# Patient Record
Sex: Female | Born: 1935 | Race: White | Hispanic: No | Smoking: Former smoker
Health system: Southern US, Community
[De-identification: ages and names within clinical notes are randomized; demographics above are authoritative.]

## PROBLEM LIST (undated history)

## (undated) DIAGNOSIS — C50919 Malignant neoplasm of unspecified site of unspecified female breast: Secondary | ICD-10-CM

## (undated) DIAGNOSIS — C189 Malignant neoplasm of colon, unspecified: Secondary | ICD-10-CM

## (undated) DIAGNOSIS — M779 Enthesopathy, unspecified: Secondary | ICD-10-CM

## (undated) DIAGNOSIS — M778 Other enthesopathies, not elsewhere classified: Secondary | ICD-10-CM

## (undated) DIAGNOSIS — I639 Cerebral infarction, unspecified: Secondary | ICD-10-CM

## (undated) HISTORY — DX: Other enthesopathies, not elsewhere classified: M77.8

## (undated) HISTORY — DX: Enthesopathy, unspecified: M77.9

## (undated) HISTORY — PX: MELANOMA EXCISION: SHX5266

## (undated) HISTORY — DX: Cerebral infarction, unspecified: I63.9

## (undated) HISTORY — DX: Malignant neoplasm of colon, unspecified: C18.9

## (undated) HISTORY — PX: BREAST LUMPECTOMY: SHX2

## (undated) HISTORY — PX: OTHER SURGICAL HISTORY: SHX169

## (undated) HISTORY — DX: Malignant neoplasm of unspecified site of unspecified female breast: C50.919

---

## 2005-05-25 ENCOUNTER — Ambulatory Visit: Payer: Self-pay | Admitting: Oncology

## 2005-06-09 ENCOUNTER — Ambulatory Visit: Payer: Self-pay | Admitting: Internal Medicine

## 2005-06-10 ENCOUNTER — Ambulatory Visit: Payer: Self-pay | Admitting: Oncology

## 2005-07-03 ENCOUNTER — Emergency Department: Payer: Self-pay | Admitting: Emergency Medicine

## 2005-07-21 ENCOUNTER — Emergency Department: Payer: Self-pay | Admitting: Emergency Medicine

## 2005-09-22 ENCOUNTER — Ambulatory Visit: Payer: Self-pay | Admitting: Oncology

## 2005-10-08 ENCOUNTER — Ambulatory Visit: Payer: Self-pay | Admitting: Oncology

## 2005-12-20 ENCOUNTER — Ambulatory Visit: Payer: Self-pay | Admitting: Unknown Physician Specialty

## 2006-01-30 ENCOUNTER — Emergency Department: Payer: Self-pay | Admitting: Emergency Medicine

## 2006-02-19 ENCOUNTER — Ambulatory Visit: Payer: Self-pay | Admitting: Internal Medicine

## 2006-03-21 ENCOUNTER — Ambulatory Visit: Payer: Self-pay | Admitting: Oncology

## 2006-04-04 ENCOUNTER — Encounter: Payer: Self-pay | Admitting: Internal Medicine

## 2006-04-09 ENCOUNTER — Ambulatory Visit: Payer: Self-pay | Admitting: Oncology

## 2006-06-14 ENCOUNTER — Ambulatory Visit: Payer: Self-pay | Admitting: Oncology

## 2006-06-20 ENCOUNTER — Ambulatory Visit: Payer: Self-pay | Admitting: Unknown Physician Specialty

## 2006-09-19 ENCOUNTER — Ambulatory Visit: Payer: Self-pay | Admitting: Oncology

## 2006-10-09 ENCOUNTER — Ambulatory Visit: Payer: Self-pay | Admitting: Oncology

## 2007-03-11 ENCOUNTER — Ambulatory Visit: Payer: Self-pay | Admitting: Oncology

## 2007-03-22 ENCOUNTER — Ambulatory Visit: Payer: Self-pay | Admitting: Oncology

## 2007-04-10 ENCOUNTER — Ambulatory Visit: Payer: Self-pay | Admitting: Oncology

## 2007-06-11 ENCOUNTER — Ambulatory Visit: Payer: Self-pay | Admitting: Oncology

## 2007-06-19 ENCOUNTER — Ambulatory Visit: Payer: Self-pay | Admitting: Oncology

## 2007-09-08 ENCOUNTER — Ambulatory Visit: Payer: Self-pay | Admitting: Oncology

## 2007-09-13 ENCOUNTER — Ambulatory Visit: Payer: Self-pay | Admitting: Oncology

## 2007-10-09 ENCOUNTER — Ambulatory Visit: Payer: Self-pay | Admitting: Oncology

## 2008-02-08 ENCOUNTER — Ambulatory Visit: Payer: Self-pay | Admitting: Oncology

## 2008-02-29 IMAGING — CT CT HEAD WITHOUT CONTRAST
2 series · 15 of 30 positions shown, 19 images · non-contrast
Comparison: none

REASON FOR EXAM: dizziness
COMMENTS:

[Series 2: without · axial · non-contrast · 0.41mm/px · z∈[-150,-25]mm · 13 of 31 slices shown, 17 images]
[im 3/31  brain]
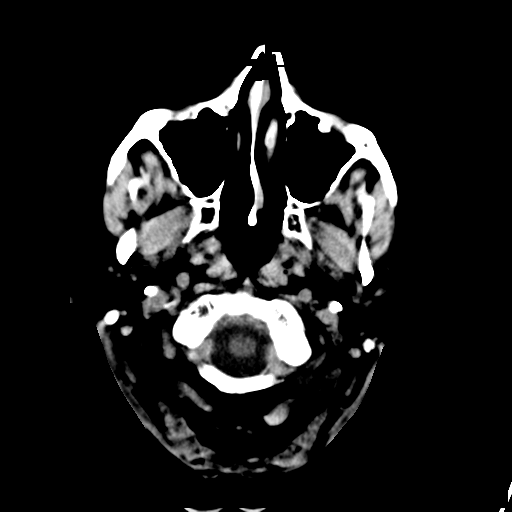
[im 3/31  bone]
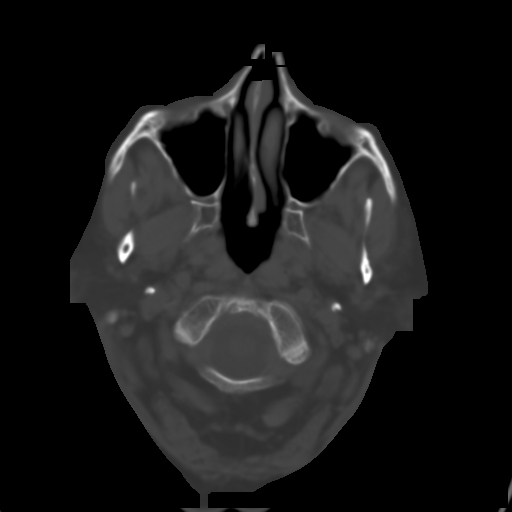
[im 5/31  brain]
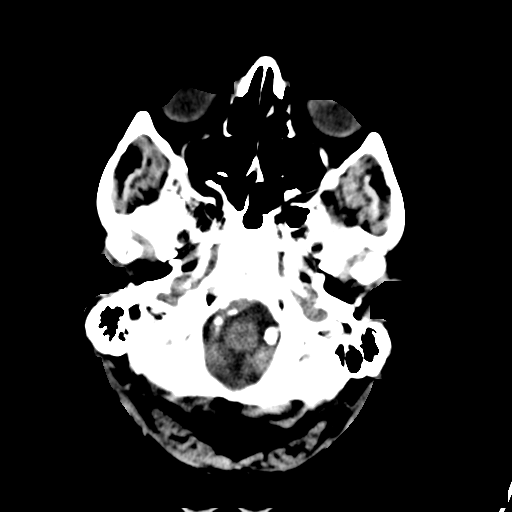
[im 7/31  brain]
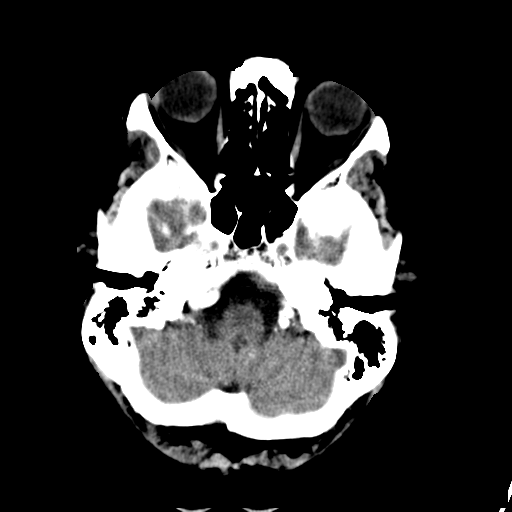
[im 9/31  brain]
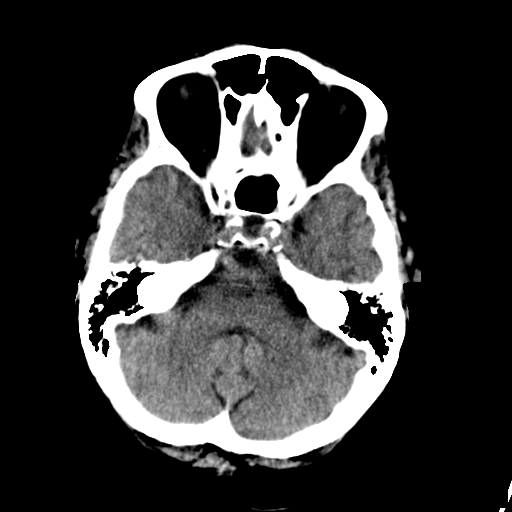
[im 11/31  brain]
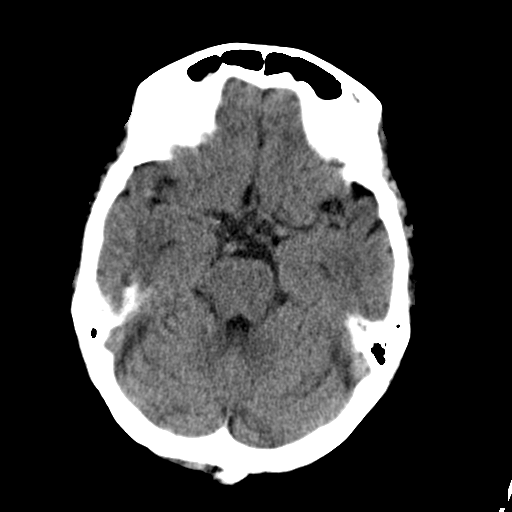
[im 11/31  bone]
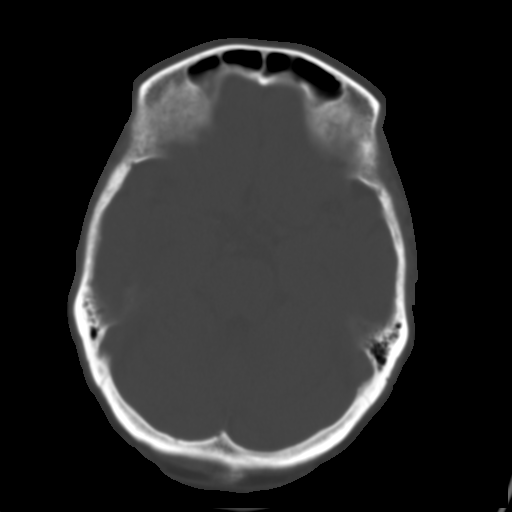
[im 13/31  brain]
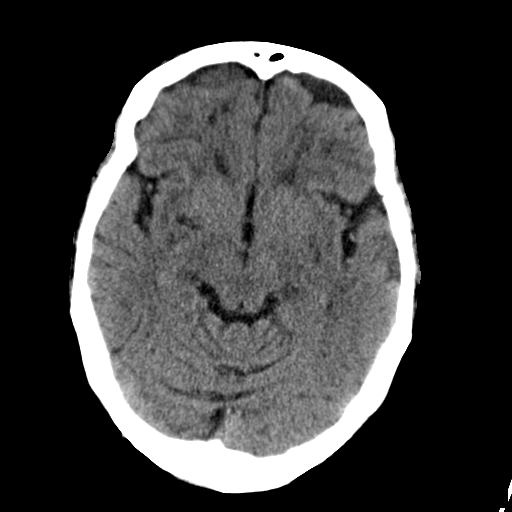
[im 16/31  brain]
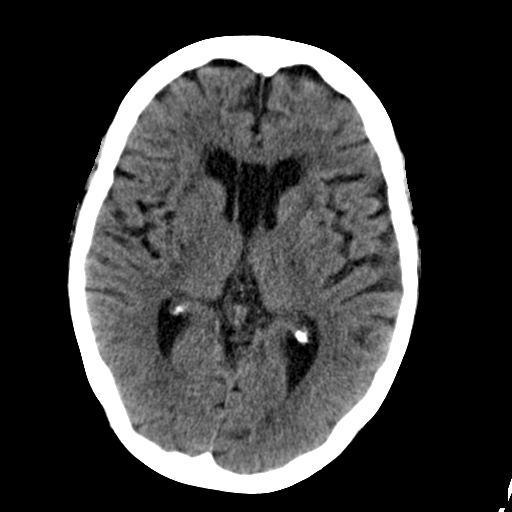
[im 18/31  brain]
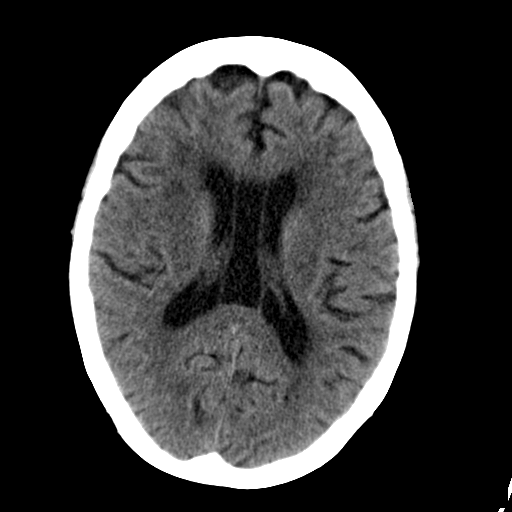
[im 20/31  brain]
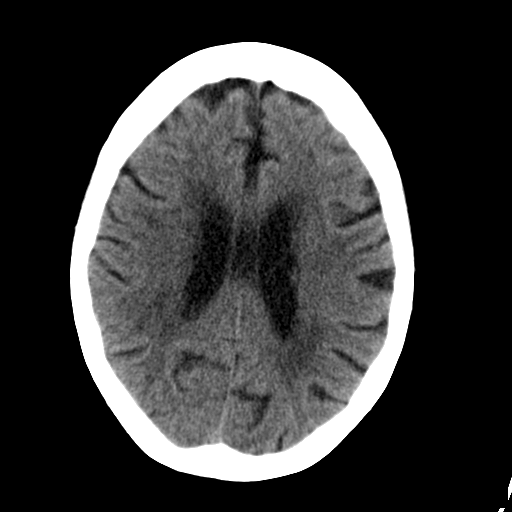
[im 20/31  bone]
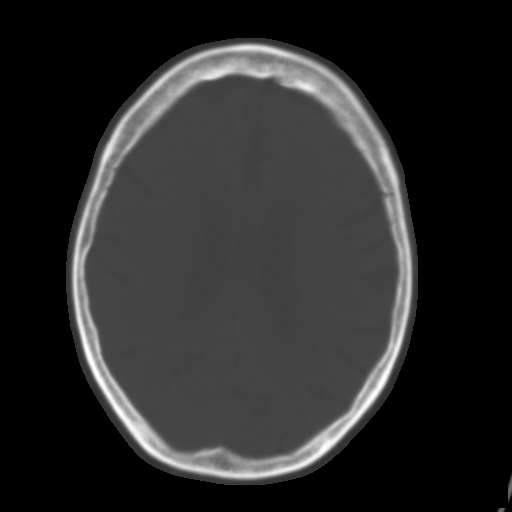
[im 22/31  brain]
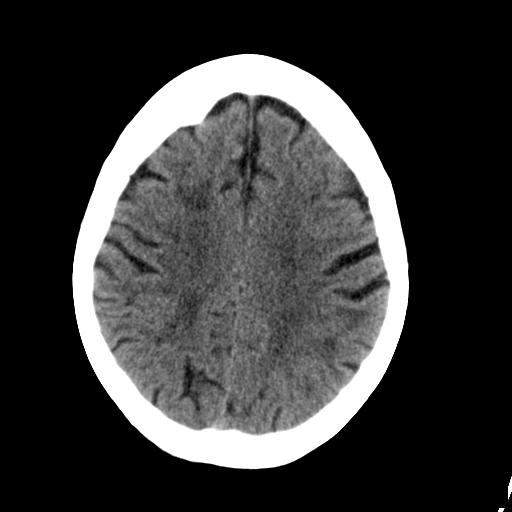
[im 24/31  brain]
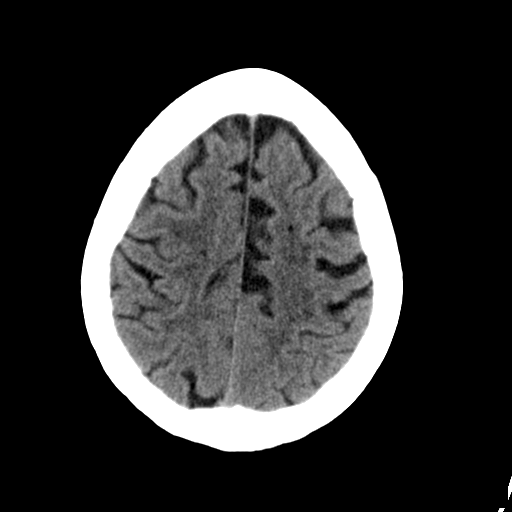
[im 26/31  brain]
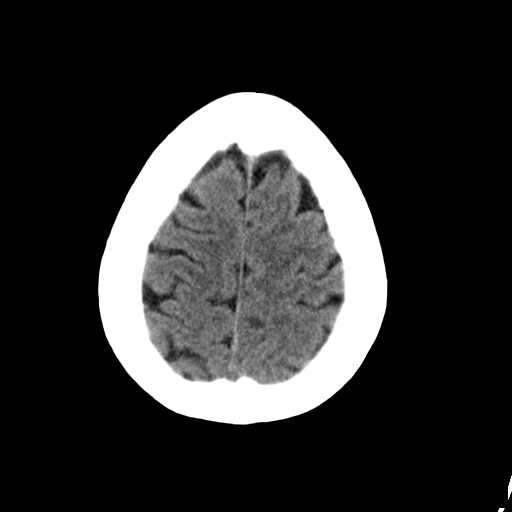
[im 28/31  brain]
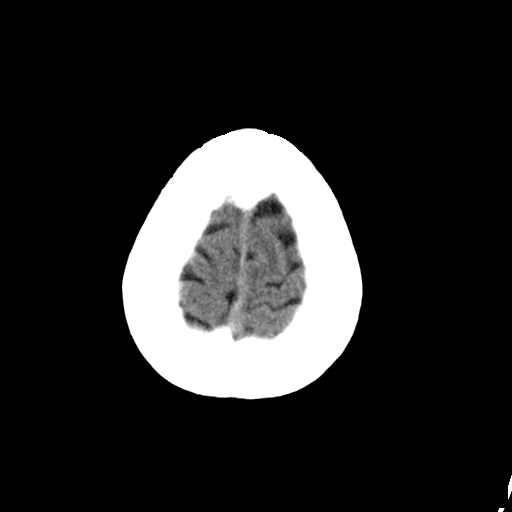
[im 28/31  bone]
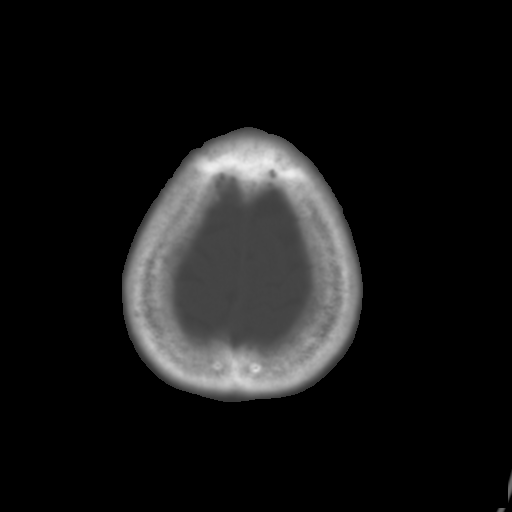

[Series 3: bone · axial · 0.41mm/px · z∈[-150,-130]mm · 2 of 31 slices shown]
[im 3/31  bone]
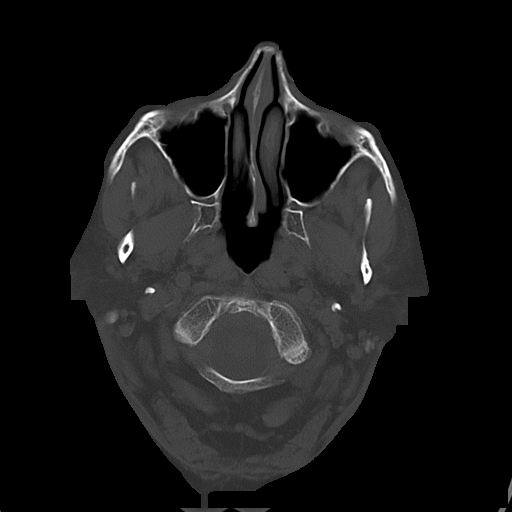
[im 7/31  bone]
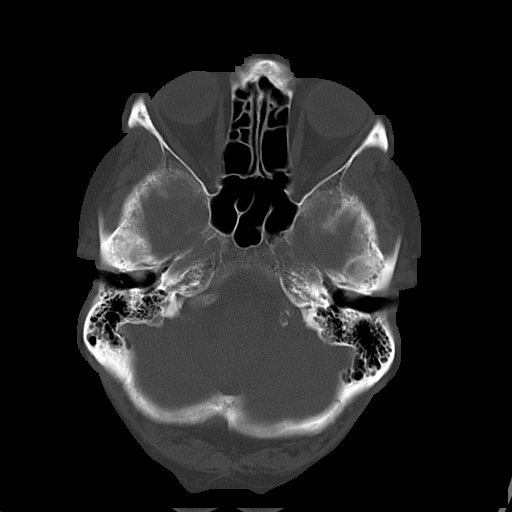

[15 of 30 positions shown; findings below may reference images not displayed]

PROCEDURE:     CT  - CT HEAD WITHOUT CONTRAST  - January 30, 2006  [DATE]

RESULT:          Noncontrast emergent CT scan of the brain is performed. The
patient has no prior CT for comparison.

The ventricles and sulci appear to be within normal limits.  There is no
evidence of an area of hemorrhage.  There is no mass effect or midline
shift.  There is a cavum septum pellucidum present.  There is ill-defined
low density in the periventricular white matter consistent with chronic
microvascular ischemic disease.  There is atherosclerotic calcification in
the vertebral arteries, greater on the LEFT than the RIGHT.  The included
paranasal sinuses and mastoid air cells appear to be normally aerated.  Bone
window images show no skull fracture.
IMPRESSION: Diffuse atrophy with chronic microvascular ischemic
disease.  There may be some tiny basal ganglia lacunar infarcts or prominent
perivascular spaces.  Incidentally noted is a cavum septum pellucidum.

## 2008-03-10 ENCOUNTER — Ambulatory Visit: Payer: Self-pay | Admitting: Oncology

## 2008-03-20 ENCOUNTER — Ambulatory Visit: Payer: Self-pay | Admitting: Oncology

## 2008-04-09 ENCOUNTER — Ambulatory Visit: Payer: Self-pay | Admitting: Oncology

## 2008-06-19 ENCOUNTER — Ambulatory Visit: Payer: Self-pay | Admitting: Oncology

## 2008-10-08 ENCOUNTER — Ambulatory Visit: Payer: Self-pay | Admitting: Oncology

## 2008-11-04 ENCOUNTER — Ambulatory Visit: Payer: Self-pay | Admitting: Oncology

## 2009-06-20 ENCOUNTER — Ambulatory Visit: Payer: Self-pay | Admitting: Oncology

## 2009-11-07 ENCOUNTER — Ambulatory Visit: Payer: Self-pay | Admitting: Oncology

## 2009-12-08 ENCOUNTER — Ambulatory Visit: Payer: Self-pay | Admitting: Oncology

## 2010-06-24 ENCOUNTER — Ambulatory Visit: Payer: Self-pay | Admitting: Oncology

## 2010-11-08 ENCOUNTER — Ambulatory Visit: Payer: Self-pay | Admitting: Oncology

## 2010-12-09 ENCOUNTER — Ambulatory Visit: Payer: Self-pay | Admitting: Oncology

## 2011-06-29 ENCOUNTER — Ambulatory Visit: Payer: Self-pay | Admitting: Oncology

## 2011-12-08 ENCOUNTER — Ambulatory Visit: Payer: Self-pay | Admitting: Oncology

## 2011-12-08 LAB — COMPREHENSIVE METABOLIC PANEL
Albumin: 3.7 g/dL (ref 3.4–5.0)
Alkaline Phosphatase: 126 U/L (ref 50–136)
Anion Gap: 8 (ref 7–16)
Calcium, Total: 8.9 mg/dL (ref 8.5–10.1)
Chloride: 104 mmol/L (ref 98–107)
Co2: 27 mmol/L (ref 21–32)
Creatinine: 0.96 mg/dL (ref 0.60–1.30)
EGFR (African American): >60
EGFR (Non-African Amer.): 54 — ABNORMAL LOW
Glucose: 98 mg/dL (ref 65–99)
Potassium: 4.5 mmol/L (ref 3.5–5.1)
SGOT(AST): 15 U/L (ref 15–37)
Sodium: 139 mmol/L (ref 136–145)
Total Protein: 7.1 g/dL (ref 6.4–8.2)

## 2011-12-08 LAB — CBC CANCER CENTER
Basophil #: 0.1 x10 3/mm (ref 0.0–0.1)
Eosinophil #: 0.2 x10 3/mm (ref 0.0–0.7)
Lymphocyte %: 25 %
MCHC: 33.4 g/dL (ref 32.0–36.0)
Monocyte %: 9.5 %
Neutrophil %: 62.6 %
RDW: 14.4 % (ref 11.5–14.5)
WBC: 8 x10 3/mm (ref 3.6–11.0)

## 2011-12-09 ENCOUNTER — Ambulatory Visit: Payer: Self-pay | Admitting: Oncology

## 2012-07-03 ENCOUNTER — Ambulatory Visit: Payer: Self-pay | Admitting: Oncology

## 2012-11-07 ENCOUNTER — Ambulatory Visit: Payer: Self-pay | Admitting: Oncology

## 2012-12-06 LAB — COMPREHENSIVE METABOLIC PANEL
Anion Gap: 12 (ref 7–16)
BUN: 24 mg/dL — ABNORMAL HIGH (ref 7–18)
Calcium, Total: 8.9 mg/dL (ref 8.5–10.1)
Co2: 23 mmol/L (ref 21–32)
Creatinine: 1.07 mg/dL (ref 0.60–1.30)
Glucose: 126 mg/dL — ABNORMAL HIGH (ref 65–99)
Osmolality: 289 (ref 275–301)
Potassium: 4.3 mmol/L (ref 3.5–5.1)
SGOT(AST): 16 U/L (ref 15–37)
SGPT (ALT): 21 U/L (ref 12–78)
Sodium: 142 mmol/L (ref 136–145)
Total Protein: 6.6 g/dL (ref 6.4–8.2)

## 2012-12-06 LAB — CBC CANCER CENTER
Basophil #: 0 x10 3/mm (ref 0.0–0.1)
Basophil %: 0.6 %
Eosinophil %: 2.1 %
HCT: 38.2 % (ref 35.0–47.0)
HGB: 13.2 g/dL (ref 12.0–16.0)
Lymphocyte #: 1.5 x10 3/mm (ref 1.0–3.6)
Lymphocyte %: 18.6 %
MCH: 31.3 pg (ref 26.0–34.0)
MCHC: 34.4 g/dL (ref 32.0–36.0)
MCV: 91 fL (ref 80–100)
Neutrophil %: 71 %
RBC: 4.2 10*6/uL (ref 3.80–5.20)
RDW: 14.7 % — ABNORMAL HIGH (ref 11.5–14.5)
WBC: 8 x10 3/mm (ref 3.6–11.0)

## 2012-12-08 ENCOUNTER — Ambulatory Visit: Payer: Self-pay | Admitting: Oncology

## 2013-02-12 ENCOUNTER — Encounter: Payer: Self-pay | Admitting: Podiatry

## 2013-02-13 ENCOUNTER — Ambulatory Visit: Payer: BC Managed Care – PPO | Admitting: Podiatry

## 2013-02-13 ENCOUNTER — Ambulatory Visit (INDEPENDENT_AMBULATORY_CARE_PROVIDER_SITE_OTHER): Payer: Medicare Other | Admitting: Podiatry

## 2013-02-13 ENCOUNTER — Encounter: Payer: Self-pay | Admitting: Podiatry

## 2013-02-13 VITALS — BP 140/77 | HR 78 | Temp 96.3°F | Resp 16 | Ht 62.0 in | Wt 182.4 lb

## 2013-02-13 DIAGNOSIS — Q828 Other specified congenital malformations of skin: Secondary | ICD-10-CM

## 2013-02-13 DIAGNOSIS — M779 Enthesopathy, unspecified: Secondary | ICD-10-CM

## 2013-02-13 MED ORDER — TRIAMCINOLONE ACETONIDE 10 MG/ML IJ SUSP
5.0000 mg | Freq: Once | INTRAMUSCULAR | Status: AC
  Administered 2013-02-13: 5 mg via INTRA_ARTICULAR

## 2013-02-13 NOTE — Progress Notes (Signed)
Subjective:     Patient ID: Jasmine Hodge, female   DOB: [birth year], 79 y.o.   MRN: [id number]  Toe Pain    patient states that the lesion has recurred between the fourth and fifth toes on the right foot. States she had relief for a period of time but the symptoms have returned.   Review of Systems  All other systems reviewed and are negative.       Objective:   Physical Exam  Nursing note and vitals reviewed. Cardiovascular: Intact distal pulses.    neurological found to be intact bilateral. Patient is well oriented x3. Keratotic lesion between the fourth and fifth toes right which is painful when pressed. Fluid buildup with inflammation around the fourth MPJ.     Assessment:     Capsulitis of the fourth MPJ right. Porokeratotic type interspace lesion fourth interspace right.    Plan:     H&P performed. Injection with 3 mg Kenalog 5 mg Xylocaine into the fourth interspace. Aggressive debridement porokeratotic type lesion accomplished with no iatrogenic bleeding.

## 2013-02-13 NOTE — Patient Instructions (Signed)
Call if any problems

## 2013-07-04 ENCOUNTER — Ambulatory Visit: Payer: Self-pay | Admitting: Oncology

## 2013-12-26 ENCOUNTER — Ambulatory Visit: Payer: Self-pay | Admitting: Oncology

## 2013-12-26 LAB — CBC CANCER CENTER
BASOS ABS: 0.1 x10 3/mm (ref 0.0–0.1)
BASOS PCT: 0.6 %
Eosinophil #: 0.2 x10 3/mm (ref 0.0–0.7)
Eosinophil %: 2.8 %
HCT: 40.9 % (ref 35.0–47.0)
HGB: 13.2 g/dL (ref 12.0–16.0)
LYMPHS PCT: 23.3 %
Lymphocyte #: 2 x10 3/mm (ref 1.0–3.6)
MCH: 30.1 pg (ref 26.0–34.0)
MCHC: 32.2 g/dL (ref 32.0–36.0)
MCV: 93 fL (ref 80–100)
MONO ABS: 0.8 x10 3/mm (ref 0.2–0.9)
Monocyte %: 8.9 %
NEUTROS PCT: 64.4 %
Neutrophil #: 5.6 x10 3/mm (ref 1.4–6.5)
Platelet: 288 x10 3/mm (ref 150–440)
RBC: 4.38 10*6/uL (ref 3.80–5.20)
RDW: 14.8 % — ABNORMAL HIGH (ref 11.5–14.5)
WBC: 8.6 x10 3/mm (ref 3.6–11.0)

## 2013-12-26 LAB — COMPREHENSIVE METABOLIC PANEL
ALK PHOS: 108 U/L
ALT: 23 U/L
Albumin: 3.5 g/dL (ref 3.4–5.0)
Anion Gap: 8 (ref 7–16)
BUN: 18 mg/dL (ref 7–18)
Bilirubin,Total: 0.3 mg/dL (ref 0.2–1.0)
CO2: 28 mmol/L (ref 21–32)
Calcium, Total: 8.3 mg/dL — ABNORMAL LOW (ref 8.5–10.1)
Chloride: 105 mmol/L (ref 98–107)
Creatinine: 1.03 mg/dL (ref 0.60–1.30)
EGFR (African American): 57 — ABNORMAL LOW
EGFR (Non-African Amer.): 49 — ABNORMAL LOW
GLUCOSE: 103 mg/dL — AB (ref 65–99)
OSMOLALITY: 283 (ref 275–301)
Potassium: 4.3 mmol/L (ref 3.5–5.1)
SGOT(AST): 19 U/L (ref 15–37)
Sodium: 141 mmol/L (ref 136–145)
Total Protein: 6.8 g/dL (ref 6.4–8.2)

## 2014-01-08 ENCOUNTER — Ambulatory Visit: Payer: Self-pay | Admitting: Oncology

## 2015-06-17 ENCOUNTER — Other Ambulatory Visit: Payer: Self-pay | Admitting: Internal Medicine

## 2015-06-17 DIAGNOSIS — R1310 Dysphagia, unspecified: Secondary | ICD-10-CM

## 2015-06-23 ENCOUNTER — Ambulatory Visit: Payer: Self-pay

## 2015-07-01 ENCOUNTER — Telehealth: Payer: Self-pay | Admitting: *Deleted

## 2015-07-01 NOTE — Telephone Encounter (Signed)
Patient has been referred to Hospice by Dr. Gilford Rile.  Wants to know if this patient is being seen at [facility]?  Called Hilda Blades back to inform her that patient has not been seen in [place] since 2015.  She states she will put Dr. Gilford Rile on as attending.

## 2015-07-23 ENCOUNTER — Telehealth: Payer: Self-pay

## 2015-07-23 NOTE — Telephone Encounter (Signed)
noted 

## 2015-07-23 NOTE — Telephone Encounter (Signed)
PLEASE NOTE: All timestamps contained within this report are represented as [place] Standard Time. CONFIDENTIALTY NOTICE: This fax transmission is intended only for the addressee. It contains information that is legally privileged, confidential or otherwise protected from use or disclosure. If you are not the intended recipient, you are strictly prohibited from reviewing, disclosing, copying using or disseminating any of this information or taking any action in reliance on or regarding this information. If you have received this fax in error, please notify us immediately by telephone so that we can arrange for its return to [place]. Phone: [phone], Toll-Free: [phone], Fax: [phone] Page: 1 of 1 Call Id: [id number] [facility] Night - Client Nonclinical Telephone Record [facility] Night - Client Client Site [facility] Physician Viviana Simpler Contact Type Call Who Is Calling Physician / Provider / Hospital Call Type Provider Call Message Only Initial Comment Roanna Raider [facility], states patient died @ 25-Jul-2015 @ 0330. Additional Comment Allexus Klette DOB March 31, 1924 Call Closed By: Templeton Lions Transaction Date/Time: 2015-07-25 4:05:54 AM (ET)

## 2015-08-09 DEATH — deceased
